# Patient Record
Sex: Male | Born: 1980 | Race: White | Hispanic: No | Marital: Married
Health system: Southern US, Community
[De-identification: ages and names within clinical notes are randomized; demographics above are authoritative.]

---

## 2000-12-26 ENCOUNTER — Encounter: Payer: Self-pay | Admitting: Orthopedic Surgery

## 2000-12-26 ENCOUNTER — Encounter: Payer: Self-pay | Admitting: Emergency Medicine

## 2000-12-26 ENCOUNTER — Inpatient Hospital Stay (HOSPITAL_COMMUNITY): Admission: EM | Admit: 2000-12-26 | Discharge: 2000-12-27 | Payer: Self-pay | Admitting: Emergency Medicine

## 2009-11-28 ENCOUNTER — Emergency Department (HOSPITAL_COMMUNITY): Admission: EM | Admit: 2009-11-28 | Discharge: 2009-11-29 | Payer: Self-pay | Admitting: Emergency Medicine

## 2010-12-01 NOTE — Op Note (Signed)
[place]. [facility]  Patient:    Steve Downs, Steve Downs                         MRN: [id number] Proc. Date: 12/26/00 Adm. Date:  [id number] Attending:  Twana First                           Operative Report  PREOPERATIVE DIAGNOSIS:  Right tibial/fibular fracture.  POSTOPERATIVE DIAGNOSIS:  Right tibial/fibular fracture.  PROCEDURE:  Closed reduction and intramedullary rod fixation, right tibia fracture.  SURGEON:  Elana Alm. Thurston Hole, M.D.  ASSISTANT:  ______  ANESTHESIA:  General.  OPERATIVE TIME:  One hour and 15 minutes.  COMPLICATIONS:  None.  DESCRIPTION OF PROCEDURE:  [name] was brought to the operating room on December 26, 2000 and placed on the operating table in supine position.  After an adequate level of general anesthesia was obtained, his right foot and leg were prepped using sterile Betadine and draped using sterile technique.  He received Ancef 1 g IV preoperatively for prophylaxis.  The leg was exsanguinated and a thigh tourniquet elevated to 350 mm.  Initially through a patellar tendon incision of 4 cm, initial exposure to the proximal tibia was made.  The underlying subcutaneous tissues were incised in line with the skin incision.  The patellar tendon was split longitudinally and a curved awl was used to broach the proximal tibia for placement of the guidewire, which was then placed down the tibial canal under fluoroscopic control, and with the fracture held in a reduced position, the guidewire was placed down to the distal tibial epiphyseal scar.  This was measured, found to be 36 mm in length and then over-drilled to a 9-mm size.  After this was done, then an 8-mm x 36-cm tibial rod was placed down the tibial canal across the fracture site and into the distal tibial metaphyseal region under anatomic position and fluoroscopic control.  The proximal screw position was used to interlock proximally, with one screw using the guide off of  the proximal position of the rod.  This screw was placed with the appropriate-length screw and fluoroscopic control confirmed satisfactory position.  After this was done, then using fluoroscopy to guide for placement of the two distal interlocking screws, a 3-cm medial incision was made over the distal tibia and each of these distal interlocking screw holes were drilled, measured and the appropriate-length interlocking distal screws placed with the fracture held in a reduced and anatomic position.  After this was done, AP and lateral fluoroscopic x-rays confirmed anatomic reduction of the fracture and satisfactory position of the hardware.  At this point then, the wounds were totally irrigated and then the patellar tendon defect closed with 0 Vicryl, subcutaneous tissues closed with 2-0 Vicryl, skin closed with skin staples and sterile dressings were applied. Tourniquet was released, neurovascular status found to be normal, short leg splint applied, patient then awakened and taken to recovery room in stable condition.  Needle and sponge count was correct x 2 at the end of the case. DD:  12/27/00 TD:  12/27/00 Job: [id number]

## 2011-05-03 IMAGING — CR DG ELBOW COMPLETE 3+V*L*
4 series · 4 of 4 positions shown · non-contrast
Comparison: None

CLINICAL DATA: Left elbow pain status post fall

LEFT ELBOW - COMPLETE 3+ VIEW

[x elbow joint ap left]
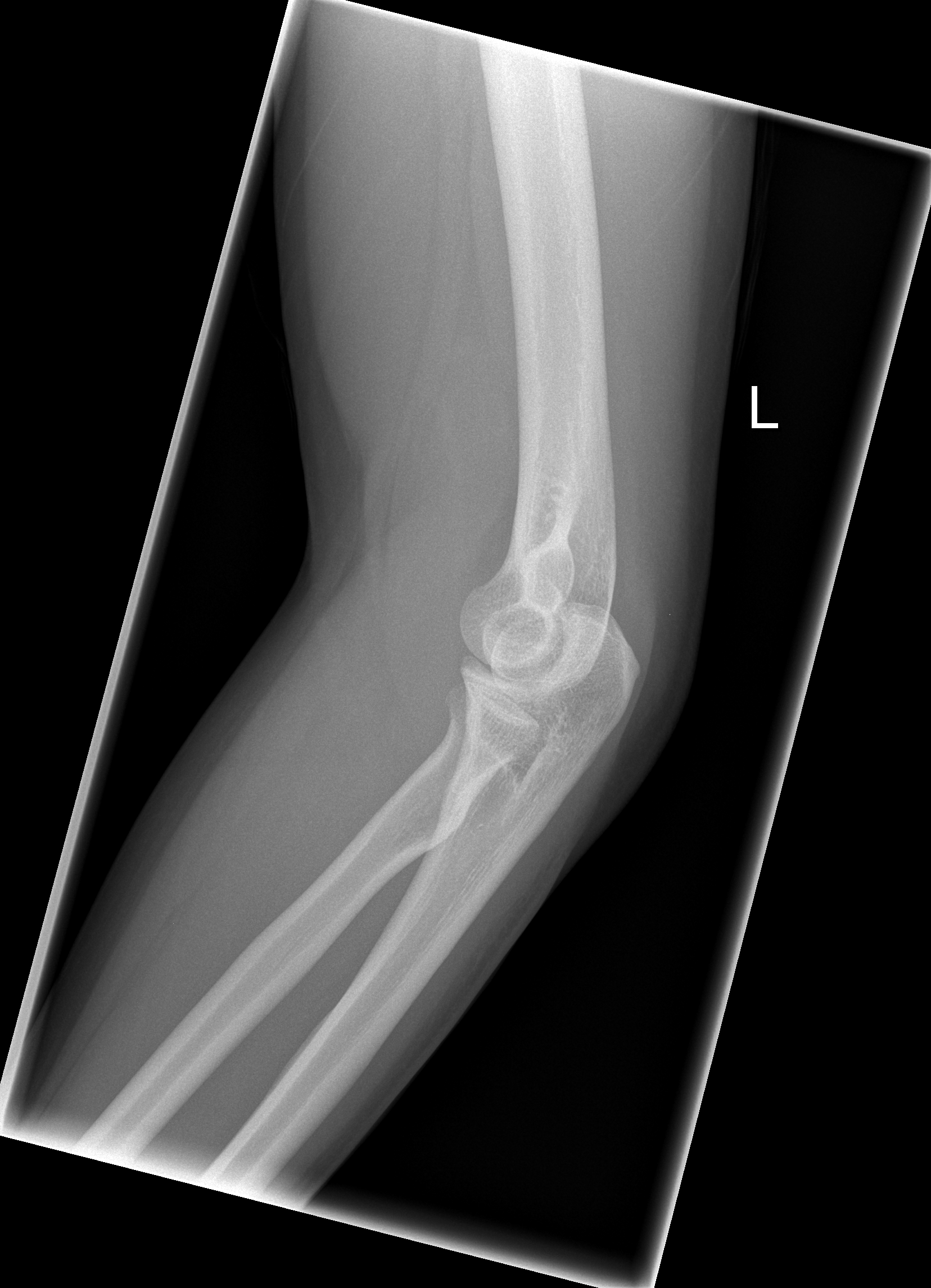

[x elbow joint obl. left (1 of 2)]
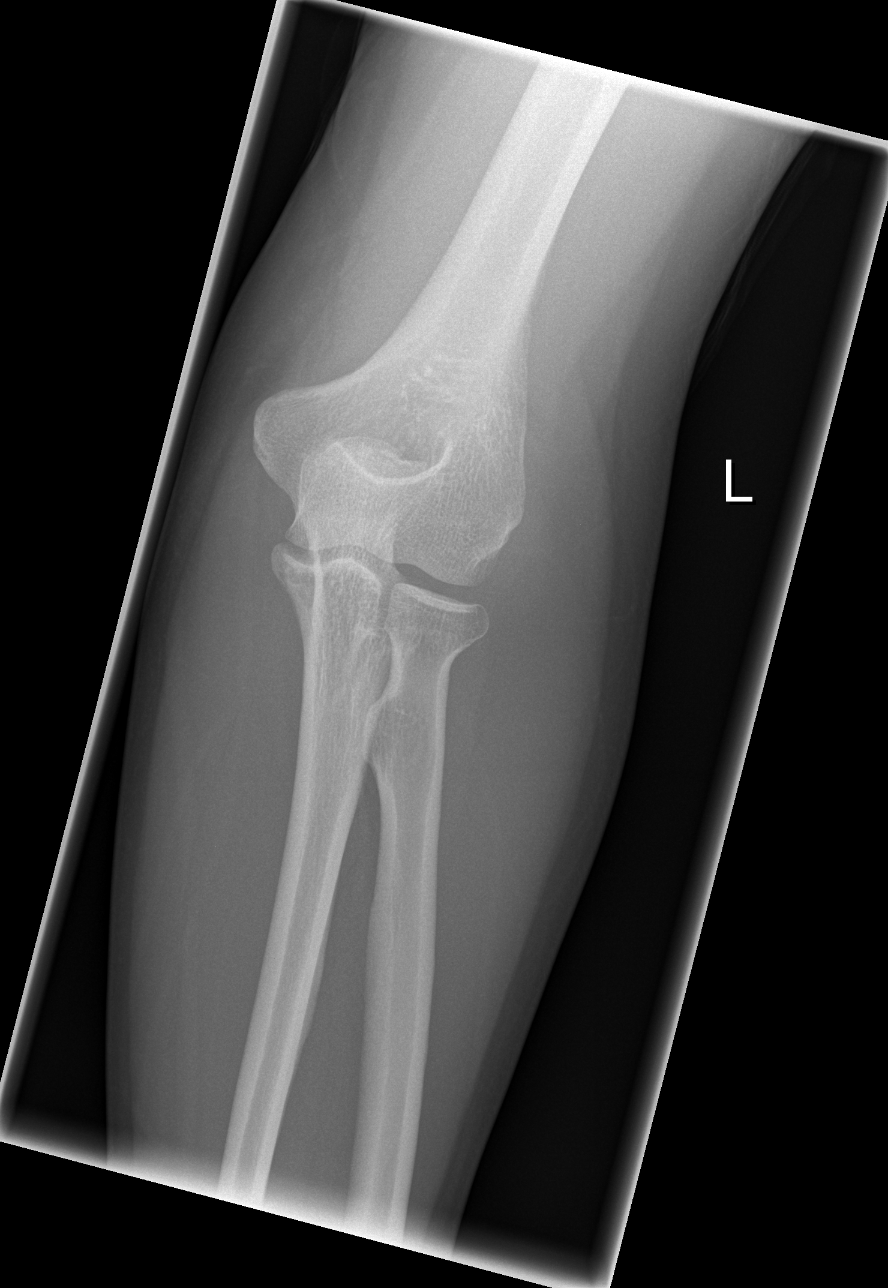

[x elbow joint obl. left (2 of 2)]
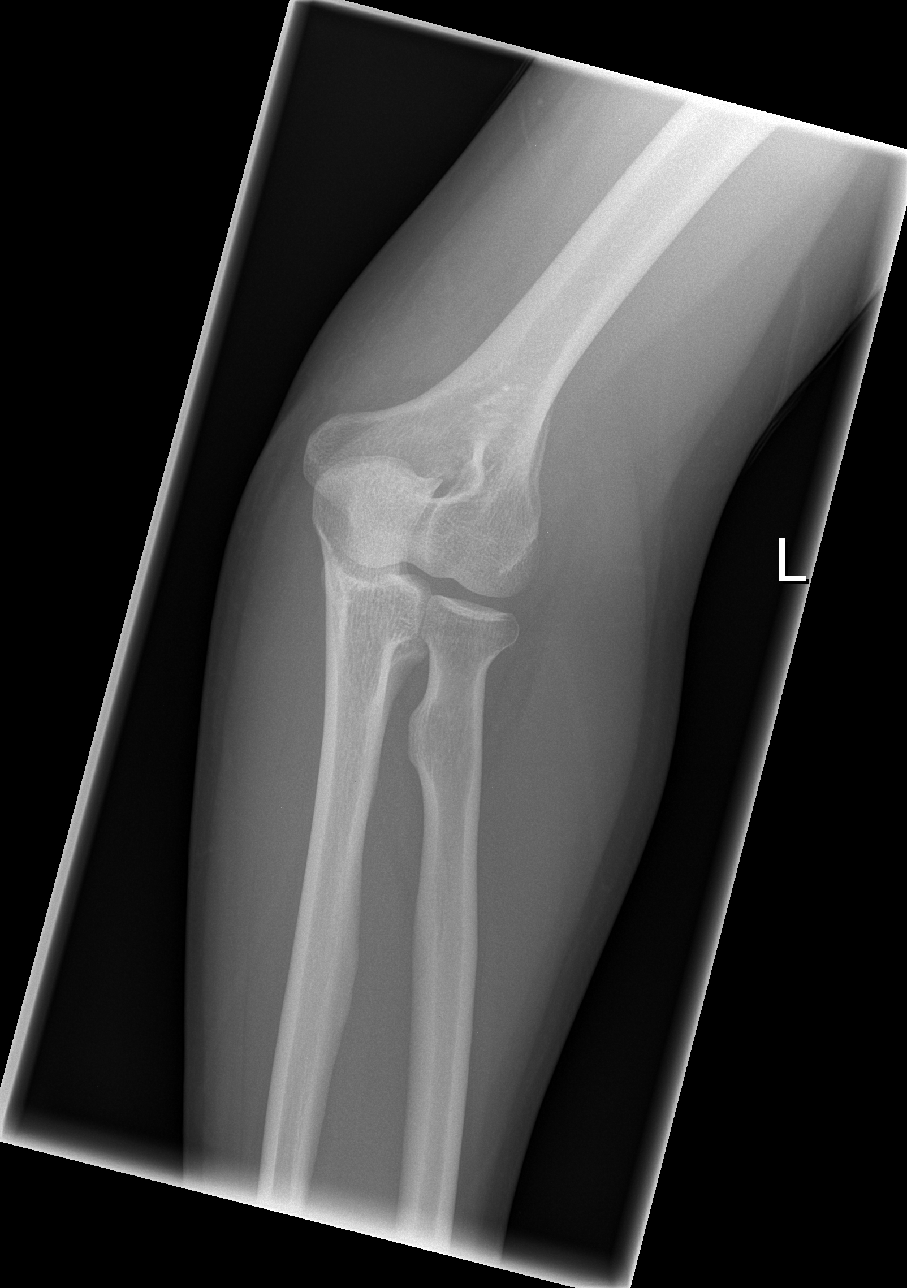

[x elbow joint lat left]
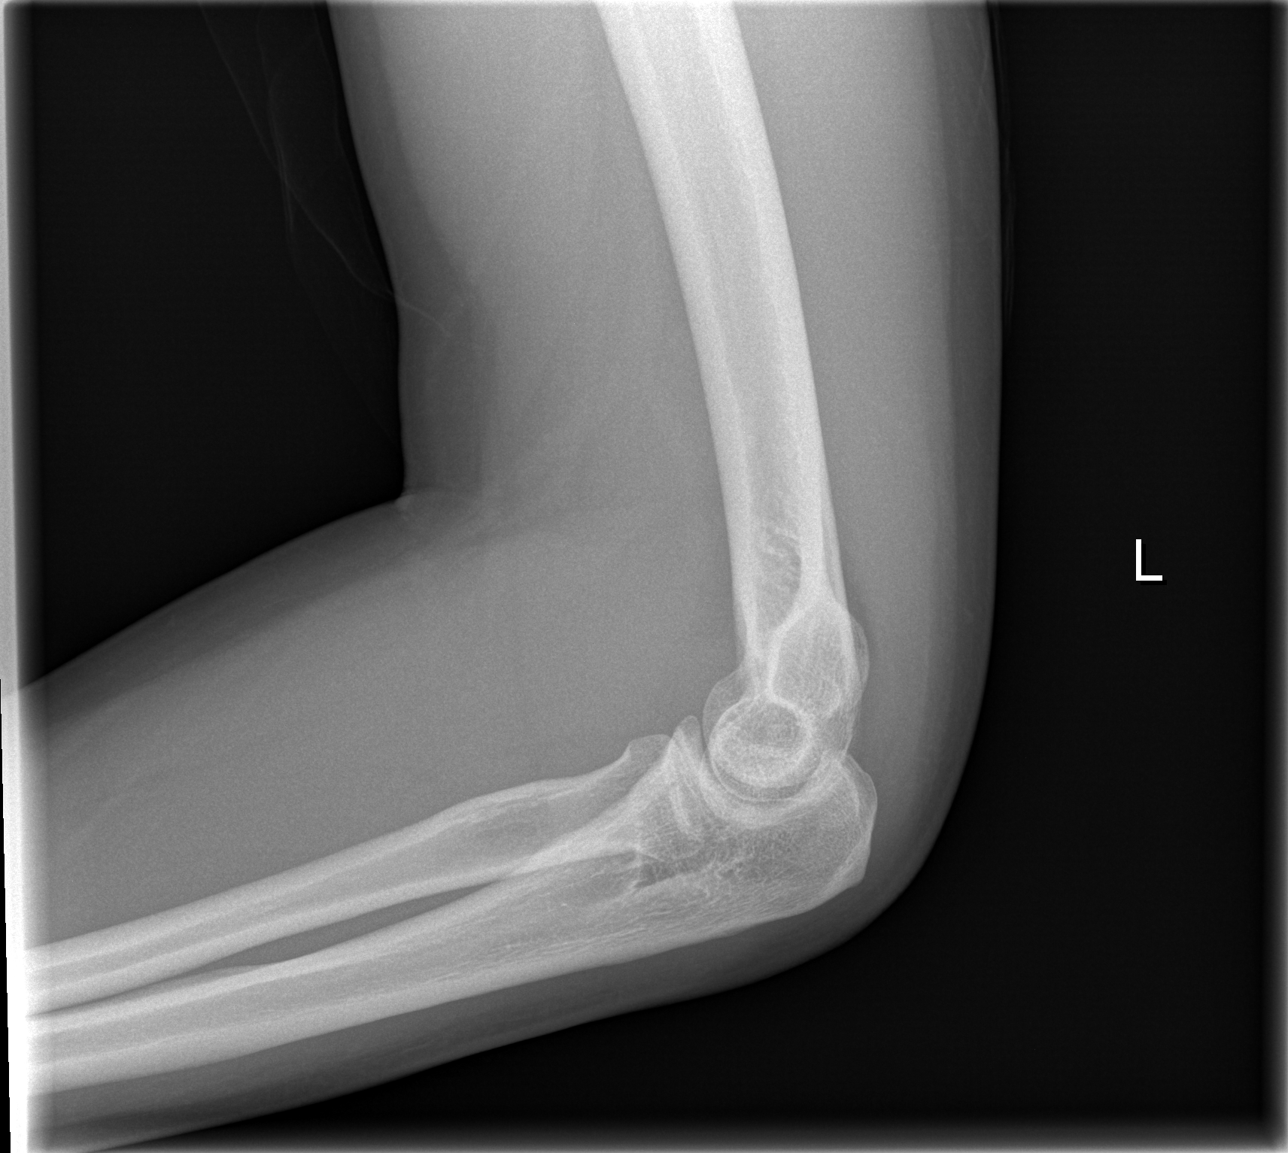

[4 of 4 positions shown; findings below may reference images not displayed]

FINDINGS: Joint spaces preserved.
Bone mineralization grossly normal.
Elbow joint effusion present.
Questionable nondisplaced radial head fracture.
No other potential fracture or dislocation is seen.
IMPRESSION: Elbow joint effusion.
Questionable nondisplaced radial head fracture.

## 2019-10-29 ENCOUNTER — Encounter (HOSPITAL_COMMUNITY): Payer: Self-pay | Admitting: Emergency Medicine

## 2019-10-29 ENCOUNTER — Emergency Department (HOSPITAL_COMMUNITY)
Admission: EM | Admit: 2019-10-29 | Discharge: 2019-10-29 | Disposition: A | Discharge status: Home / Self Care | Payer: Self-pay | Attending: Emergency Medicine | Admitting: Emergency Medicine

## 2019-10-29 ENCOUNTER — Other Ambulatory Visit: Payer: Self-pay

## 2019-10-29 DIAGNOSIS — K0889 Other specified disorders of teeth and supporting structures: Secondary | ICD-10-CM | POA: Insufficient documentation

## 2019-10-29 MED ORDER — PENICILLIN V POTASSIUM 500 MG PO TABS: 500.0000 mg | ORAL_TABLET | Freq: Four times a day (QID) | ORAL | 0 refills | Status: AC

## 2019-10-29 MED ORDER — NAPROXEN 500 MG PO TABS: 500.0000 mg | ORAL_TABLET | Freq: Two times a day (BID) | ORAL | 0 refills | Status: AC

## 2019-10-29 MED ORDER — BENZOCAINE 20 % MT GEL: 1.0000 "application " | Freq: Three times a day (TID) | OROMUCOSAL | 0 refills | Status: AC | PRN

## 2019-10-29 NOTE — Discharge Instructions (Signed)
As discussed, I am sending home with an antibiotic, pain medication, and numbing gel.  Please take all antibiotics as prescribed and finish them.  Please follow-up with dentist next week for further evaluation. Return to the ER for new or worsening symptoms.

## 2019-10-29 NOTE — ED Provider Notes (Signed)
[facility] DEPT Provider Note   CSN: [id number] Arrival date & time: 10/29/19  1723     History Chief Complaint  Patient presents with  . Dental Pain    Steve Downs is a 39 y.o. male with no significant past medical history who presents to the ED due to right lower dental pain that has been intermittent in nature for the past 2 months.  Patient notes his pain worsened over the past 24 hours.  He has tried over-the-counter ibuprofen with moderate relief.  Denies fever and chills.  Admits to subjective cheek edema.  He last saw a dentist roughly 4 years ago.  He has a dental appointment scheduled for next week. Denies tongue swelling, trismus, changes to phonation, stiff neck, and difficulties tolerating oral secretions. No alleviating or aggravating factors.   History obtained from patient and past medical records. No interpreter used during encounter.      History reviewed. No pertinent past medical history.  There are no problems to display for this patient.   History reviewed. No pertinent surgical history.     No family history on file.  Social History   Tobacco Use  . Smoking status: Not on file  Substance Use Topics  . Alcohol use: Not on file  . Drug use: Not on file    Home Medications Prior to Admission medications   Medication Sig Start Date End Date Taking? Authorizing Provider  benzocaine (HURRICAINE) 20 % GEL Use as directed 1 application in the mouth or throat 3 (three) times daily as needed. 10/29/19   Suzy Bouchard, PA-C  naproxen (NAPROSYN) 500 MG tablet Take 1 tablet (500 mg total) by mouth 2 (two) times daily. 10/29/19   Suzy Bouchard, PA-C  penicillin v potassium (VEETID) 500 MG tablet Take 1 tablet (500 mg total) by mouth 4 (four) times daily for 7 days. 10/29/19 11/05/19  Suzy Bouchard, PA-C    Allergies    Patient has no allergy information on record.  Review of Systems   Review of Systems    Constitutional: Negative for chills and fever.  HENT: Positive for dental problem. Negative for sore throat, trouble swallowing and voice change.   Respiratory: Negative for shortness of breath.   All other systems reviewed and are negative.   Physical Exam Updated Vital Signs BP (!) 151/97 (BP Location: Left Arm)   Pulse 80   Temp 98.1 F (36.7 C) (Oral)   Resp 16   SpO2 100%   Physical Exam Vitals and nursing note reviewed.  Constitutional:      General: He is not in acute distress.    Appearance: He is not ill-appearing.  HENT:     Head: Normocephalic.     Mouth/Throat:     Comments: Good dentition throughout. No tenderness or gingivitis noted on right lower molars. No abscess appreciated. No trismus. Tongue in normal position without protrusion. No overlying cheek edema. Tolerating oral secretions without difficulty.  Eyes:     Pupils: Pupils are equal, round, and reactive to light.  Neck:     Comments: No meningismus. Cardiovascular:     Rate and Rhythm: Normal rate and regular rhythm.     Pulses: Normal pulses.     Heart sounds: Normal heart sounds. No murmur. No friction rub. No gallop.   Pulmonary:     Effort: Pulmonary effort is normal.     Breath sounds: Normal breath sounds.  Abdominal:     General: Abdomen  is flat. There is no distension.     Palpations: Abdomen is soft.     Tenderness: There is no abdominal tenderness. There is no guarding or rebound.  Musculoskeletal:     Cervical back: Neck supple.     Comments: Able to move all 4 extremities without difficulty.  Skin:    General: Skin is warm and dry.  Neurological:     General: No focal deficit present.     Mental Status: He is alert.  Psychiatric:        Mood and Affect: Mood normal.        Behavior: Behavior normal.     ED Results / Procedures / Treatments   Labs (all labs ordered are listed, but only abnormal results are displayed) Labs Reviewed - No data to  display  EKG None  Radiology No results found.  Procedures Procedures (including critical care time)  Medications Ordered in ED Medications - No data to display  ED Course  I have reviewed the triage vital signs and the nursing notes.  Pertinent labs & imaging results that were available during my care of the patient were reviewed by me and considered in my medical decision making (see chart for details).    MDM Rules/Calculators/A&P                     39 year old male presents to the ED due to right lower dental pain that has been intermittent for the past 2 months.  Patient has a scheduled dentist appointment next week. Denies fever and chills.  Upon arrival, patient is afebrile not tachycardic or hypoxic.  Patient in no acute distress and non-ill-appearing.  Physical exam reassuring.  Good dentition throughout.  No trismus.  No abscess appreciated on exam.  Tolerating oral secretions without difficulty.  No concern for Ludwig's or deep space infection. Will treat with naproxen and Pen VK. Advised patient to go to his scheduled dental appointment next week. Strict ED precautions discussed with patient. Patient states understanding and agrees to plan. Patient discharged home in no acute distress and stable vitals  Final Clinical Impression(s) / ED Diagnoses Final diagnoses:  Pain, dental    Rx / DC Orders ED Discharge Orders         Ordered    penicillin v potassium (VEETID) 500 MG tablet  4 times daily     10/29/19 1934    naproxen (NAPROSYN) 500 MG tablet  2 times daily     10/29/19 1934    benzocaine (HURRICAINE) 20 % GEL  3 times daily PRN     10/29/19 1934           Jesusita Oka 10/29/19 1935    Sabas Sous, MD 10/30/19 [id number]

## 2019-10-29 NOTE — ED Triage Notes (Signed)
Right lower dental pain on and off for 1-2 months-increased pain this am-OTC meds not helping-cant get into dentist till nest week

## 2024-06-11 ENCOUNTER — Emergency Department (HOSPITAL_COMMUNITY)
Admission: EM | Admit: 2024-06-11 | Discharge: 2024-06-11 | Disposition: A | Discharge status: Home / Self Care | Payer: Self-pay | Attending: Emergency Medicine | Admitting: Emergency Medicine

## 2024-06-11 ENCOUNTER — Other Ambulatory Visit: Payer: Self-pay

## 2024-06-11 DIAGNOSIS — M546 Pain in thoracic spine: Secondary | ICD-10-CM | POA: Insufficient documentation

## 2024-06-11 MED ORDER — METHOCARBAMOL 500 MG PO TABS: 500.0000 mg | ORAL_TABLET | Freq: Two times a day (BID) | ORAL | 0 refills | Status: AC

## 2024-06-11 MED ORDER — HYDROCODONE-ACETAMINOPHEN 5-325 MG PO TABS: 1.0000 | ORAL_TABLET | Freq: Four times a day (QID) | ORAL | 0 refills | Status: AC | PRN

## 2024-06-11 MED ORDER — KETOROLAC TROMETHAMINE 15 MG/ML IJ SOLN
15.0000 mg | Freq: Once | INTRAMUSCULAR | Status: AC
  Administered 2024-06-11: 15 mg via INTRAMUSCULAR
  Filled 2024-06-11: qty 1

## 2024-06-11 MED ORDER — NAPROXEN 500 MG PO TABS: 500.0000 mg | ORAL_TABLET | Freq: Two times a day (BID) | ORAL | 0 refills | Status: AC

## 2024-06-11 NOTE — ED Triage Notes (Signed)
 Pt arrived via POV. Pt c/o back pain that started 3 days. Mechanism of injury unknown. Pt reports pain/spasm worse upon movement.

## 2024-06-11 NOTE — Discharge Instructions (Signed)
 It was a pleasure taking care of you today.  As discussed, your exam was reassuring. It is likely muscular in nature.  I am sending you home with pain medication and muscle relaxers.  Hydrocodone  and Robaxin  can cause drowsiness so do not drive or operate machinery while on the medication.  Save hydrocodone  for severe pain and take naproxen  for mild to moderate pain.  I have included the number of the orthopedic surgeon.  Please call if symptoms do not improve over the next week.  I have included low back exercises.  Perform daily.  Back pain can take up to 6 weeks to completely resolve.  Return to the ER for any new or worsening symptoms.

## 2024-06-11 NOTE — ED Provider Notes (Signed)
 [place] EMERGENCY DEPARTMENT AT [facility] Provider Note   CSN: [id number] Arrival date & time: 06/11/24  9377     Patient presents with: No chief complaint on file.   Steve Downs is a 43 y.o. male with no significant past medical history who presents to the ED due to thoracic back pain x 3 days. Pain is nonradiating. No known injury.  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  Denies saddle anesthesia, bowel/bladder incontinence, lower extremity numbness/tingling, lower extremity weakness.  No fever or chills.  Denies IV drug use and history of cancer.  Patient describes back pain as spasms.  Not currently having any pain.  Has a Lidoderm patch on mid back.  Denies abdominal pain.  No chest pain or shortness of breath.  History obtained from patient and past medical records. No interpreter used during encounter.       Prior to Admission medications   Medication Sig Start Date End Date Taking? Authorizing Provider  HYDROcodone -acetaminophen  (NORCO/VICODIN) 5-325 MG tablet Take 1 tablet by mouth every 6 (six) hours as needed. 06/11/24  Yes Mae Denunzio C, PA-C  methocarbamol  (ROBAXIN ) 500 MG tablet Take 1 tablet (500 mg total) by mouth 2 (two) times daily. 06/11/24  Yes Jamespaul Secrist, Aleck BROCKS, PA-C  naproxen  (NAPROSYN ) 500 MG tablet Take 1 tablet (500 mg total) by mouth 2 (two) times daily. 06/11/24  Yes Veatrice Eckstein, Aleck BROCKS, PA-C  benzocaine  (HURRICAINE) 20 % GEL Use as directed 1 application in the mouth or throat 3 (three) times daily as needed. 10/29/19   Azahel Belcastro C, PA-C  naproxen  (NAPROSYN ) 500 MG tablet Take 1 tablet (500 mg total) by mouth 2 (two) times daily. 10/29/19   Allister Lessley C, PA-C    Allergies: Patient has no allergy information on record.    Review of Systems  Constitutional:  Negative for fever.  Cardiovascular:  Negative for chest pain.   Gastrointestinal:  Negative for abdominal pain.  Musculoskeletal:  Positive for back pain.    Updated Vital Signs BP (!) 148/97   Pulse 86   Temp 97.7 F (36.5 C)   Resp 17   SpO2 98%   Physical Exam Vitals and nursing note reviewed.  Constitutional:      General: He is not in acute distress.    Appearance: He is not ill-appearing.  HENT:     Head: Normocephalic.  Eyes:     Pupils: Pupils are equal, round, and reactive to light.  Neck:     Comments: No cervical midline tenderness Cardiovascular:     Rate and Rhythm: Normal rate and regular rhythm.     Pulses: Normal pulses.     Heart sounds: Normal heart sounds. No murmur heard.    No friction rub. No gallop.  Pulmonary:     Effort: Pulmonary effort is normal.     Breath sounds: Normal breath sounds.  Abdominal:     General: Abdomen is flat. There is no distension.     Palpations: Abdomen is soft.     Tenderness: There is no abdominal tenderness. There is no guarding or rebound.  Musculoskeletal:        General: Normal range of motion.     Cervical back: Neck supple.     Comments: Lidoderm patch on midline thoracic spine.  No thoracic or lumbar midline tenderness.  Able to ambulate in the room without difficulty.  Skin:    General: Skin is warm and dry.  Neurological:     General: No focal deficit present.     Mental Status: He is alert.  Psychiatric:        Mood and Affect: Mood normal.        Behavior: Behavior normal.     (all labs ordered are listed, but only abnormal results are displayed) Labs Reviewed - No data to display  EKG: None  Radiology: No results found.   Procedures   Medications Ordered in the ED  ketorolac  (TORADOL ) 15 MG/ML injection 15 mg (15 mg Intramuscular Given 06/11/24 0734)                                    Medical Decision Making Risk Prescription drug management.   This patient presents to the ED for concern of thoracic back pain, this involves an extensive  number of treatment options, and is a complaint that carries with it a high risk of complications and morbidity.  The differential diagnosis includes infection, central cord compression, muscular etiology, dissection, PE, etc  43 year old male presents to the ED due to thoracic back pain x 3 days.  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 Denies saddle anesthesia, bowel/bladder incontinence, lower extremity numbness/tingling, lower extremity weakness. Denies IV drug use. No fever or chills.  Upon arrival, stable vitals.  Patient well-appearing on exam.  Lidoderm patch on midline thoracic spine.  No thoracic or lumbar midline tenderness.  Able to ambulate in the room without difficulty. Given no direct injury, low suspicion for bony fracture, so will hold off on imaging. Low suspicion for cauda equina or central cord compression.  No infectious symptoms to suggest infectious etiology. No abdominal pain or chest pain. Low suspicion for dissection. Normal O2 saturation and no tachycardia, low suspicion for PE. Possible muscular etiology.  Will treat symptomatically at this time.  Patient discharged with muscle relaxers and pain medication.  Discussed over-the-counter medication as well.  Orthopedics number given to patient discharge advised to call symptoms do not improve in 1 week. Patient stable for discharge. Strict ED precautions discussed with patient. Patient states understanding and agrees to plan. Patient discharged home in no acute distress and stable vitals  No PCP    Final diagnoses:  Acute midline thoracic back pain    ED Discharge Orders          Ordered    methocarbamol  (ROBAXIN ) 500 MG tablet  2 times daily        06/11/24 0727    HYDROcodone -acetaminophen  (NORCO/VICODIN) 5-325 MG tablet  Every 6 hours PRN        06/11/24 0727    naproxen  (NAPROSYN ) 500 MG tablet  2 times daily        06/11/24 0727                Kenzey Birkland C, PA-C 06/11/24 0738    Gennaro Duwaine CROME, DO 06/12/24 [phone]
